# Patient Record
Sex: Male | Born: 2000 | Race: White | Hispanic: No | Marital: Single | Smoking: Current every day smoker
Health system: Southern US, Community
[De-identification: ages and names within clinical notes are randomized; demographics above are authoritative.]

## PROBLEM LIST (undated history)

## (undated) DIAGNOSIS — G47419 Narcolepsy without cataplexy: Secondary | ICD-10-CM

## (undated) DIAGNOSIS — F319 Bipolar disorder, unspecified: Secondary | ICD-10-CM

## (undated) DIAGNOSIS — F909 Attention-deficit hyperactivity disorder, unspecified type: Secondary | ICD-10-CM

---
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
## 2020-01-28 ENCOUNTER — Other Ambulatory Visit: Payer: Self-pay

## 2020-01-28 ENCOUNTER — Encounter (HOSPITAL_COMMUNITY): Payer: Self-pay

## 2020-01-28 ENCOUNTER — Emergency Department (HOSPITAL_COMMUNITY)
Admission: EM | Admit: 2020-01-28 | Discharge: 2020-01-29 | Disposition: A | Discharge status: Home / Self Care | Payer: Medicaid Other | Attending: Emergency Medicine | Admitting: Emergency Medicine

## 2020-01-28 DIAGNOSIS — Z20822 Contact with and (suspected) exposure to covid-19: Secondary | ICD-10-CM | POA: Insufficient documentation

## 2020-01-28 DIAGNOSIS — F17200 Nicotine dependence, unspecified, uncomplicated: Secondary | ICD-10-CM | POA: Insufficient documentation

## 2020-01-28 DIAGNOSIS — J069 Acute upper respiratory infection, unspecified: Secondary | ICD-10-CM | POA: Insufficient documentation

## 2020-01-28 DIAGNOSIS — R059 Cough, unspecified: Secondary | ICD-10-CM | POA: Diagnosis present

## 2020-01-28 NOTE — ED Triage Notes (Signed)
Patient reports headache, fatigue, sore throat, nausea and vomiting, + exposure 2 days ago.

## 2020-01-29 LAB — RESP PANEL BY RT-PCR (FLU A&B, COVID) ARPGX2
Influenza A by PCR: NEGATIVE
Influenza B by PCR: NEGATIVE
SARS Coronavirus 2 by RT PCR: NEGATIVE

## 2020-01-29 MED ORDER — ACETAMINOPHEN 325 MG PO TABS
650.0000 mg | ORAL_TABLET | Freq: Once | ORAL | Status: AC | PRN
  Administered 2020-01-29: 650 mg via ORAL

## 2020-01-29 NOTE — ED Provider Notes (Signed)
[facility] EMERGENCY DEPARTMENT Provider Note   CSN: [id number] Arrival date & time: 01/28/20  2311     History Chief Complaint  Patient presents with  . Generalized Body Aches    Tony Bird is a 20 y.o. male.  20 year old male with past medical history below who presents with body aches, cough, sore throat.  Patient reports getting sore throat associated with headache, fatigue, productive cough, nausea/vomiting starting yesterday morning.  He was exposed to someone with COVID-19 2 days ago.  He denies any fevers, breathing problems, nasal congestion, or diarrhea.  He took Mucinex earlier today but no other medications for his symptoms.  He is fully vaccinated against COVID-19.  The history is provided by the patient.       Past Medical History:  Diagnosis Date  . ADHD   . Bipolar 1 disorder (HCC)   . Narcolepsy     There are no problems to display for this patient.   History reviewed. No pertinent surgical history.     Family History  Problem Relation Age of Onset  . Cancer Mother   . Healthy Father     Social History   Tobacco Use  . Smoking status: Current Every Day Smoker  . Smokeless tobacco: Never Used  Vaping Use  . Vaping Use: Every day  Substance Use Topics  . Alcohol use: Yes    Comment: occ  . Drug use: Never    Home Medications Prior to Admission medications   Medication Sig Start Date End Date Taking? Authorizing Provider  HYDROcodone-acetaminophen (NORCO/VICODIN) 5-325 MG tablet Take 1 tablet by mouth every 4 (four) hours as needed. 09/19/18   Garlon Hatchet, PA-C  naproxen (NAPROSYN) 500 MG tablet Take 1 tablet (500 mg total) by mouth 2 (two) times daily with a meal. 09/19/18   Garlon Hatchet, PA-C    Allergies    Patient has no known allergies.  Review of Systems   Review of Systems All other systems reviewed and are negative except that which was mentioned in HPI  Physical Exam Updated Vital Signs BP (!)  140/98 (BP Location: Right Arm)   Pulse 87   Temp 98.8 F (37.1 C) (Oral)   Resp 17   Ht 6\' 3"  (1.905 m)   Wt 81.6 kg   SpO2 100%   BMI 22.50 kg/m   Physical Exam Vitals and nursing note reviewed.  Constitutional:      General: He is not in acute distress.    Appearance: He is well-developed and well-nourished.  HENT:     Head: Normocephalic and atraumatic.     Mouth/Throat:     Mouth: Mucous membranes are moist.     Pharynx: Posterior oropharyngeal erythema present.     Comments: B/l tonsillar enlargement w/ erythema, no asymmetry, uvula midline, no exudates     Comments: Moist mucous membranesEyes:     Conjunctiva/sclera: Conjunctivae normal.  Cardiovascular:     Rate and Rhythm: Normal rate and regular rhythm.     Heart sounds: Normal heart sounds. No murmur heard.   Pulmonary:     Effort: Pulmonary effort is normal.     Breath sounds: Normal breath sounds.  Abdominal:     General: Bowel sounds are normal. There is no distension.     Palpations: Abdomen is soft.     Tenderness: There is no abdominal tenderness.  Musculoskeletal:        General: No edema.     Cervical back: Neck  supple.  Skin:    General: Skin is warm and dry.  Neurological:     Mental Status: He is alert and oriented to person, place, and time.     Gait: Gait normal.     Comments: Fluent speech  Psychiatric:        Mood and Affect: Mood and affect normal.        Judgment: Judgment normal.     ED Results / Procedures / Treatments   Labs (all labs ordered are listed, but only abnormal results are displayed) Labs Reviewed  RESP PANEL BY RT-PCR (FLU A&B, COVID) [id number]    EKG None  Radiology No results found.  Procedures Procedures (including critical care time)  Medications Ordered in ED Medications - No data to display  ED Course  I have reviewed the triage vital signs and the nursing notes.  Pertinent labs that were available during my care of the patient were reviewed by me  and considered in my medical decision making (see chart for details).    MDM Rules/Calculators/A&P                          Well appearing on exam, reassuring VS. COVID and influenza negative. Suspect viral illness. Discussed supportive measures for his symptoms.  Reviewed return precautions including breathing problems.  Patient voiced understanding.  Tony Bird was evaluated in Emergency Department on 01/29/2020 for the symptoms described in the history of present illness. He was evaluated in the context of the global COVID-19 pandemic, which necessitated consideration that the patient might be at risk for infection with the SARS-CoV-2 virus that causes COVID-19. Institutional protocols and algorithms that pertain to the evaluation of patients at risk for COVID-19 are in a state of rapid change based on information released by regulatory bodies including the CDC and federal and state organizations. These policies and algorithms were followed during the patient's care in the ED.  Final Clinical Impression(s) / ED Diagnoses Final diagnoses:  Viral upper respiratory tract infection    Rx / DC Orders ED Discharge Orders    None       Selby Slovacek, Ambrose Finland, MD 01/29/20 0410

## 2020-02-01 ENCOUNTER — Emergency Department (HOSPITAL_COMMUNITY)
Admission: EM | Admit: 2020-02-01 | Discharge: 2020-02-01 | Disposition: A | Discharge status: Home / Self Care | Payer: Medicaid Other | Attending: Emergency Medicine | Admitting: Emergency Medicine

## 2020-02-01 ENCOUNTER — Other Ambulatory Visit: Payer: Self-pay

## 2020-02-01 DIAGNOSIS — R6883 Chills (without fever): Secondary | ICD-10-CM | POA: Insufficient documentation

## 2020-02-01 DIAGNOSIS — R531 Weakness: Secondary | ICD-10-CM | POA: Insufficient documentation

## 2020-02-01 DIAGNOSIS — R221 Localized swelling, mass and lump, neck: Secondary | ICD-10-CM | POA: Diagnosis present

## 2020-02-01 DIAGNOSIS — U071 COVID-19: Secondary | ICD-10-CM | POA: Diagnosis not present

## 2020-02-01 LAB — GROUP A STREP BY PCR: Group A Strep by PCR: NOT DETECTED

## 2020-02-01 LAB — RESP PANEL BY RT-PCR (FLU A&B, COVID) ARPGX2
Influenza A by PCR: NEGATIVE
Influenza B by PCR: NEGATIVE
SARS Coronavirus 2 by RT PCR: POSITIVE — AB

## 2020-02-01 NOTE — ED Triage Notes (Addendum)
Pt here with reports of swollen tonsils X1 week. States seen last week and tested for covid with negative result. Denies fevers, endorses weakness and chills.

## 2020-07-09 ENCOUNTER — Other Ambulatory Visit: Payer: Self-pay

## 2020-07-09 ENCOUNTER — Emergency Department (HOSPITAL_COMMUNITY)
Admission: EM | Admit: 2020-07-09 | Discharge: 2020-07-09 | Disposition: A | Discharge status: Home / Self Care | Payer: Medicaid Other | Attending: Emergency Medicine | Admitting: Emergency Medicine

## 2020-07-09 DIAGNOSIS — F17200 Nicotine dependence, unspecified, uncomplicated: Secondary | ICD-10-CM | POA: Diagnosis not present

## 2020-07-09 DIAGNOSIS — E860 Dehydration: Secondary | ICD-10-CM | POA: Insufficient documentation

## 2020-07-09 DIAGNOSIS — M62838 Other muscle spasm: Secondary | ICD-10-CM

## 2020-07-09 DIAGNOSIS — R252 Cramp and spasm: Secondary | ICD-10-CM | POA: Diagnosis present

## 2020-07-09 LAB — CK: Total CK: 141 U/L (ref 49–397)

## 2020-07-09 LAB — URINALYSIS, ROUTINE W REFLEX MICROSCOPIC
Bilirubin Urine: NEGATIVE
Glucose, UA: NEGATIVE mg/dL
Hgb urine dipstick: NEGATIVE
Ketones, ur: NEGATIVE mg/dL
Leukocytes,Ua: NEGATIVE
Nitrite: NEGATIVE
Protein, ur: NEGATIVE mg/dL
Specific Gravity, Urine: 1.011 (ref 1.005–1.030)
pH: 7 (ref 5.0–8.0)

## 2020-07-09 LAB — CBC WITH DIFFERENTIAL/PLATELET
Abs Immature Granulocytes: 0.01 10*3/uL (ref 0.00–0.07)
Basophils Absolute: 0.1 10*3/uL (ref 0.0–0.1)
Basophils Relative: 1 %
Eosinophils Absolute: 0.1 10*3/uL (ref 0.0–0.5)
Eosinophils Relative: 2 %
HCT: 41.8 % (ref 39.0–52.0)
Hemoglobin: 13.8 g/dL (ref 13.0–17.0)
Immature Granulocytes: 0 %
Lymphocytes Relative: 36 %
Lymphs Abs: 2.1 10*3/uL (ref 0.7–4.0)
MCH: 29.8 pg (ref 26.0–34.0)
MCHC: 33 g/dL (ref 30.0–36.0)
MCV: 90.3 fL (ref 80.0–100.0)
Monocytes Absolute: 0.7 10*3/uL (ref 0.1–1.0)
Monocytes Relative: 11 %
Neutro Abs: 2.9 10*3/uL (ref 1.7–7.7)
Neutrophils Relative %: 50 %
Platelets: 223 10*3/uL (ref 150–400)
RBC: 4.63 MIL/uL (ref 4.22–5.81)
RDW: 12.2 % (ref 11.5–15.5)
WBC: 5.9 10*3/uL (ref 4.0–10.5)
nRBC: 0 % (ref 0.0–0.2)

## 2020-07-09 LAB — COMPREHENSIVE METABOLIC PANEL
ALT: 13 U/L (ref 0–44)
AST: 21 U/L (ref 15–41)
Albumin: 4.3 g/dL (ref 3.5–5.0)
Alkaline Phosphatase: 52 U/L (ref 38–126)
Anion gap: 10 (ref 5–15)
BUN: 8 mg/dL (ref 6–20)
CO2: 23 mmol/L (ref 22–32)
Calcium: 9 mg/dL (ref 8.9–10.3)
Chloride: 106 mmol/L (ref 98–111)
Creatinine, Ser: 0.87 mg/dL (ref 0.61–1.24)
GFR, Estimated: >60 mL/min (ref >60)
Glucose, Bld: 98 mg/dL (ref 70–99)
Potassium: 3.6 mmol/L (ref 3.5–5.1)
Sodium: 139 mmol/L (ref 135–145)
Total Bilirubin: 0.8 mg/dL (ref 0.3–1.2)
Total Protein: 6.8 g/dL (ref 6.5–8.1)

## 2020-07-09 LAB — RAPID URINE DRUG SCREEN, HOSP PERFORMED
Amphetamines: NOT DETECTED
Barbiturates: NOT DETECTED
Benzodiazepines: NOT DETECTED
Cocaine: NOT DETECTED
Opiates: NOT DETECTED
Tetrahydrocannabinol: POSITIVE — AB

## 2020-07-09 LAB — LIPASE, BLOOD: Lipase: 21 U/L (ref 11–51)

## 2020-07-09 MED ORDER — SODIUM CHLORIDE 0.9 % IV BOLUS
1000.0000 mL | Freq: Once | INTRAVENOUS | Status: AC
  Administered 2020-07-09: 1000 mL via INTRAVENOUS

## 2020-07-09 NOTE — ED Triage Notes (Signed)
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 Pt states he is unable to hydrate and eat properly at work. He has not urinated in 24 hours. Per EMS patient has been intermittently spasming to the right side for about an hour. Pt VS per EMS  HR-140 sinus tach BP-118/80   He was given 1000 cc of NS en route.

## 2020-07-09 NOTE — ED Notes (Signed)
Pt given sprite 

## 2020-07-09 NOTE — ED Notes (Signed)
Pt ambulated from bathroom without difficulty.  Urinated 400 ml.

## 2020-07-09 NOTE — ED Provider Notes (Addendum)
[facility] EMERGENCY DEPARTMENT Provider Note   CSN: [id number] Arrival date & time: 07/09/20  1237    History Chief Complaint  Patient presents with   Dehydration    Tony Bird is a 20 y.o. male with past medical history sniffing bipolar, ADHD, narcolepsy who presents for evaluation of muscle cramping.  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  Started new job 2 weeks ago. Has not been drinking very much water.  He feels like his urine has been darker for he is also urinating less. States last urinated 24 hours ago. He feels like he has had muscle spasms and cramping to his right upper extremity and right lower extremity.  Per EMS, patient with intermittent spasms to his right extremities which are painful when they occur. He is able to follow commands during these episodes. No seizure like activity. Difficult to move this due to the pain.  States he intermittently feels pins-and-needles "all over."  He has no facial droop.  States he has been undergoing stressors due to a friend "almost dying on me."  He denies illicit substance use or alcohol use.  He is unsure of the timing when all the symptoms began.  No SI, HI, AVH.  Feels intermittently lightheaded.  He feels like he is dehydrated. States 15 pound weight loss since starting new job. No headache, blurred vision.  Denies additional aggravating or alleviating factors.  History obtained from patient and past medical records.  No interpreter used.  HPI     Past Medical History:  Diagnosis Date   ADHD    Bipolar 1 disorder (HCC)    Narcolepsy     There are no problems to display for this patient.   No past surgical history on file.     Family History  Problem Relation Age of Onset   Cancer Mother    Healthy Father     Social History   Tobacco Use   Smoking status: Every Day    Pack years: 0.00   Smokeless tobacco: Never  Vaping Use   Vaping Use: Every day  Substance Use Topics    Alcohol use: Yes    Comment: occ   Drug use: Never    Home Medications Prior to Admission medications   Medication Sig Start Date End Date Taking? Authorizing Provider  HYDROcodone-acetaminophen (NORCO/VICODIN) 5-325 MG tablet Take 1 tablet by mouth every 4 (four) hours as needed. 09/19/18   Garlon Hatchet, PA-C  naproxen (NAPROSYN) 500 MG tablet Take 1 tablet (500 mg total) by mouth 2 (two) times daily with a meal. 09/19/18   Garlon Hatchet, PA-C    Allergies    Patient has no known allergies.  Review of Systems   Review of Systems  Constitutional: Negative.   HENT: Negative.    Respiratory: Negative.    Cardiovascular: Negative.   Gastrointestinal: Negative.   Genitourinary:  Positive for decreased urine volume. Negative for difficulty urinating, dysuria, flank pain, frequency, hematuria, penile discharge, penile pain, penile swelling, scrotal swelling, testicular pain and urgency.  Musculoskeletal: Negative.   Neurological:  Positive for weakness (Generalized) and numbness (Tingling). Negative for dizziness, tremors, seizures, syncope, facial asymmetry, speech difficulty, light-headedness and headaches.  All other systems reviewed and are negative.  Physical Exam Updated Vital Signs BP 134/73 (BP Location: Right Arm)   Pulse (!) 111   Temp 99.2 F (37.3 C) (Oral)   Resp 18   SpO2 100%   Physical Exam Vitals and nursing note  reviewed.  Constitutional:      General: He is not in acute distress.    Appearance: He is well-developed. He is not ill-appearing, toxic-appearing or diaphoretic.  HENT:     Head: Normocephalic and atraumatic.     Nose: Nose normal.     Mouth/Throat:     Mouth: Mucous membranes are moist.  Eyes:     Pupils: Pupils are equal, round, and reactive to light.  Cardiovascular:     Rate and Rhythm: Normal rate and regular rhythm.     Pulses: Normal pulses.          Radial pulses are 2+ on the right side and 2+ on the left side.       Dorsalis pedis  pulses are 2+ on the right side and 2+ on the left side.     Heart sounds: Normal heart sounds.  Pulmonary:     Effort: Pulmonary effort is normal. No respiratory distress.     Breath sounds: Normal breath sounds.  Abdominal:     General: Bowel sounds are normal. There is no distension.     Palpations: Abdomen is soft.  Musculoskeletal:        General: Tenderness present. No swelling, deformity or signs of injury. Normal range of motion.     Cervical back: Normal range of motion and neck supple.     Right lower leg: No edema.     Left lower leg: No edema.     Comments: Diffuse tenderness to bilateral upper and lower extremities.  No obvious spasms.  No bony tenderness.  Decreased range of motion to right upper extremity and right lower extremity.  Skin:    General: Skin is warm and dry.     Capillary Refill: Capillary refill takes less than 2 seconds.  Neurological:     General: No focal deficit present.     Mental Status: He is alert and oriented to person, place, and time.     Cranial Nerves: Cranial nerves are intact.     Motor: No tremor, atrophy, abnormal muscle tone or seizure activity.     Comments: Cranial 2-12 grossly intact Intact sensation bilateral upper and lower extremities Does not move right upper and lower extremity which patient states is secondary to pain.  No tremors, seizure-like activity.    ED Results / Procedures / Treatments   Labs (all labs ordered are listed, but only abnormal results are displayed) Labs Reviewed  CBC WITH DIFFERENTIAL/PLATELET  COMPREHENSIVE METABOLIC PANEL  CK  LIPASE, BLOOD  URINALYSIS, ROUTINE W REFLEX MICROSCOPIC  RAPID URINE DRUG SCREEN, HOSP PERFORMED    EKG EKG Interpretation  Date/Time:  Wednesday July 09 2020 12:44:46 EDT Ventricular Rate:  106 PR Interval:  140 QRS Duration: 90 QT Interval:  318 QTC Calculation: 422 R Axis:   100 Text Interpretation: Sinus tachycardia Right atrial enlargement Rightward axis  Pulmonary disease pattern Abnormal ECG No old tracing to compare Confirmed by Meridee Score [id number]) on 07/09/2020 12:47:21 PM  Radiology No results found.  Procedures Procedures   Medications Ordered in ED Medications  sodium chloride 0.9 % bolus 1,000 mL (1,000 mLs Intravenous New Bag/Given 07/09/20 1333)  sodium chloride 0.9 % bolus 1,000 mL (0 mLs Intravenous Stopped 07/09/20 1334)   ED Course  I have reviewed the triage vital signs and the nursing notes.  Pertinent labs & imaging results that were available during my care of the patient were reviewed by me and considered in my medical decision making (see  chart for details).   20 year old presents for evaluation multiple complaints.  He is afebrile, nonseptic, not ill-appearing.  Patient with apparent decreased urination, spasms to right upper and lower extremities.  Apparently started construction job around 2 weeks ago and has had difficulty hydrating.  States is not urinated in 24 hours and prior to this is very dark.  He does report spasms to his entire right side however no seizure-like activity.  It is difficult to get a thorough neurologic exam with patient as he is not moving his right upper or lower extremity.  I feel this is likely due to effort as patient was visualized by nursing moving these extremities immediately after my evaluation and was able to ambulate to urinate wo deficits. Patient is a does not seem consistent with seizure, CVA.  Does have concerning story for possible rhabdomyolysis.  We will plan on labs, imaging, IV fluid hydration and reassess.  Labs personally reviewed and interpreted:  CBC without acute abnormality CMP without acute abnormality Lipase 21 CK 141 UA negative for infection UDS positive for [facility]  Patient reassessed.  He is texting on his phone using his right hand.  He has a nonfocal neuro exam without deficits.  He is got up to ambulate to the restroom without difficulty multiple times.   Question patient was having some muscle spasms from some possible early dehydration?  He is tolerating p.o. intake here without difficulty.  Reassuring lab work-up without evidence of rhabdomyolysis.  NV intact. DC home with rest, increase fluids, strict return precautions  Patient and mother agreeable with plan and FU.  The patient has been appropriately medically screened and/or stabilized in the ED. I have low suspicion for any other emergent medical condition which would require further screening, evaluation or treatment in the ED or require inpatient management.  Patient is hemodynamically stable and in no acute distress.  Patient able to ambulate in department prior to ED.  Evaluation does not show acute pathology that would require ongoing or additional emergent interventions while in the emergency department or further inpatient treatment.  I have discussed the diagnosis with the patient and answered all questions.  Pain is been managed while in the emergency department and patient has no further complaints prior to discharge.  Patient is comfortable with plan discussed in room and is stable for discharge at this time.  I have discussed strict return precautions for returning to the emergency department.  Patient was encouraged to follow-up with PCP/specialist refer to at discharge.    MDM Rules/Calculators/A&P                           Final Clinical Impression(s) / ED Diagnoses Final diagnoses:  Dehydration  Muscle spasm    Rx / DC Orders ED Discharge Orders     None          Geo Slone A, PA-C 07/09/20 1509    Terrilee Files, MD 07/09/20 2103

## 2020-07-09 NOTE — Discharge Instructions (Addendum)
Make sure to rest and drink plenty of fluids  Return for new or worsening symptoms

## 2020-11-06 ENCOUNTER — Ambulatory Visit (HOSPITAL_COMMUNITY)
Admission: EM | Admit: 2020-11-06 | Discharge: 2020-11-06 | Disposition: A | Discharge status: Home / Self Care | Payer: Medicaid Other | Attending: Physician Assistant | Admitting: Physician Assistant

## 2020-11-06 ENCOUNTER — Other Ambulatory Visit: Payer: Self-pay

## 2020-11-06 ENCOUNTER — Ambulatory Visit (INDEPENDENT_AMBULATORY_CARE_PROVIDER_SITE_OTHER): Payer: Medicaid Other

## 2020-11-06 ENCOUNTER — Encounter (HOSPITAL_COMMUNITY): Payer: Self-pay | Admitting: Emergency Medicine

## 2020-11-06 DIAGNOSIS — W19XXXA Unspecified fall, initial encounter: Secondary | ICD-10-CM

## 2020-11-06 DIAGNOSIS — M25561 Pain in right knee: Secondary | ICD-10-CM | POA: Diagnosis not present

## 2020-11-06 DIAGNOSIS — M25461 Effusion, right knee: Secondary | ICD-10-CM

## 2020-11-06 MED ORDER — DICLOFENAC SODIUM 75 MG PO TBEC: 75.0000 mg | DELAYED_RELEASE_TABLET | Freq: Two times a day (BID) | ORAL | 0 refills | Status: AC
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
## 2020-11-06 NOTE — Discharge Instructions (Addendum)
Your x-ray was normal.  Please keep your knee elevated and use ice for additional symptom relief.  I also recommend you use the brace to help with stability.  I think it is important that you follow-up with an orthopedic provider so please call them to schedule an appointment.  Use diclofenac for pain and swelling.  You should not take NSAIDs including aspirin, ibuprofen/Advil, naproxen/Aleve with this medication as it can cause stomach bleeding.  If you have any worsening symptoms you need to be reevaluated.
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
